# Patient Record
Sex: Male | Born: 1950 | Race: Black or African American | Hispanic: No
Health system: Southern US, Community
[De-identification: ages and names within clinical notes are randomized; demographics above are authoritative.]

---

## 2005-01-18 ENCOUNTER — Emergency Department: Payer: Self-pay | Admitting: Emergency Medicine

## 2005-02-05 ENCOUNTER — Inpatient Hospital Stay: Payer: Self-pay | Admitting: Internal Medicine

## 2005-03-19 ENCOUNTER — Emergency Department: Payer: Self-pay | Admitting: Emergency Medicine

## 2005-03-30 ENCOUNTER — Emergency Department: Payer: Self-pay | Admitting: Emergency Medicine

## 2005-03-30 ENCOUNTER — Other Ambulatory Visit: Payer: Self-pay

## 2005-06-16 ENCOUNTER — Inpatient Hospital Stay: Payer: Self-pay | Admitting: Internal Medicine

## 2005-06-16 ENCOUNTER — Other Ambulatory Visit: Payer: Self-pay

## 2005-06-17 ENCOUNTER — Other Ambulatory Visit: Payer: Self-pay

## 2005-06-18 ENCOUNTER — Other Ambulatory Visit: Payer: Self-pay

## 2005-06-28 ENCOUNTER — Other Ambulatory Visit: Payer: Self-pay

## 2005-06-28 ENCOUNTER — Emergency Department: Payer: Self-pay | Admitting: Emergency Medicine

## 2005-07-01 ENCOUNTER — Emergency Department: Payer: Self-pay | Admitting: Emergency Medicine

## 2005-07-01 ENCOUNTER — Other Ambulatory Visit: Payer: Self-pay

## 2005-09-07 ENCOUNTER — Other Ambulatory Visit: Payer: Self-pay

## 2005-09-07 ENCOUNTER — Emergency Department: Payer: Self-pay | Admitting: Emergency Medicine

## 2005-12-11 ENCOUNTER — Other Ambulatory Visit: Payer: Self-pay

## 2005-12-11 ENCOUNTER — Emergency Department: Payer: Self-pay | Admitting: Emergency Medicine

## 2005-12-13 ENCOUNTER — Other Ambulatory Visit: Payer: Self-pay

## 2005-12-13 ENCOUNTER — Emergency Department: Payer: Self-pay | Admitting: Emergency Medicine

## 2006-01-09 ENCOUNTER — Other Ambulatory Visit: Payer: Self-pay

## 2006-01-10 ENCOUNTER — Inpatient Hospital Stay: Payer: Self-pay | Admitting: Internal Medicine

## 2006-03-03 ENCOUNTER — Inpatient Hospital Stay: Payer: Self-pay | Admitting: Internal Medicine

## 2006-03-03 ENCOUNTER — Other Ambulatory Visit: Payer: Self-pay

## 2006-05-04 ENCOUNTER — Observation Stay: Payer: Self-pay | Admitting: Internal Medicine

## 2006-08-19 ENCOUNTER — Other Ambulatory Visit: Payer: Self-pay

## 2006-08-19 ENCOUNTER — Observation Stay: Payer: Self-pay | Admitting: Cardiovascular Disease

## 2006-12-01 ENCOUNTER — Inpatient Hospital Stay: Payer: Self-pay | Admitting: *Deleted

## 2006-12-01 ENCOUNTER — Other Ambulatory Visit: Payer: Self-pay

## 2006-12-19 ENCOUNTER — Other Ambulatory Visit: Payer: Self-pay

## 2006-12-19 ENCOUNTER — Inpatient Hospital Stay: Payer: Self-pay | Admitting: Internal Medicine

## 2006-12-20 ENCOUNTER — Other Ambulatory Visit: Payer: Self-pay

## 2007-01-19 ENCOUNTER — Ambulatory Visit: Payer: Self-pay | Admitting: Internal Medicine

## 2007-01-28 ENCOUNTER — Emergency Department: Payer: Self-pay | Admitting: Unknown Physician Specialty

## 2007-01-28 ENCOUNTER — Other Ambulatory Visit: Payer: Self-pay

## 2007-02-19 ENCOUNTER — Emergency Department: Payer: Self-pay | Admitting: Internal Medicine

## 2007-02-19 ENCOUNTER — Other Ambulatory Visit: Payer: Self-pay

## 2007-03-07 ENCOUNTER — Other Ambulatory Visit: Payer: Self-pay

## 2007-03-07 ENCOUNTER — Emergency Department: Payer: Self-pay | Admitting: Unknown Physician Specialty

## 2007-03-11 ENCOUNTER — Emergency Department: Payer: Self-pay | Admitting: Emergency Medicine

## 2007-04-21 ENCOUNTER — Ambulatory Visit: Payer: Self-pay | Admitting: Internal Medicine

## 2007-07-02 ENCOUNTER — Other Ambulatory Visit: Payer: Self-pay

## 2007-07-02 ENCOUNTER — Ambulatory Visit: Payer: Self-pay | Admitting: Internal Medicine

## 2007-07-02 ENCOUNTER — Emergency Department: Payer: Self-pay | Admitting: Internal Medicine

## 2007-07-20 ENCOUNTER — Other Ambulatory Visit: Payer: Self-pay

## 2007-07-20 ENCOUNTER — Ambulatory Visit: Payer: Self-pay | Admitting: Internal Medicine

## 2007-09-11 ENCOUNTER — Emergency Department: Payer: Self-pay | Admitting: Emergency Medicine

## 2007-09-11 ENCOUNTER — Other Ambulatory Visit: Payer: Self-pay

## 2007-10-21 ENCOUNTER — Emergency Department: Payer: Self-pay | Admitting: Internal Medicine

## 2007-10-21 ENCOUNTER — Other Ambulatory Visit: Payer: Self-pay

## 2007-11-22 ENCOUNTER — Other Ambulatory Visit: Payer: Self-pay

## 2007-11-22 ENCOUNTER — Inpatient Hospital Stay: Payer: Self-pay | Admitting: Internal Medicine

## 2007-12-30 ENCOUNTER — Other Ambulatory Visit: Payer: Self-pay

## 2007-12-30 ENCOUNTER — Inpatient Hospital Stay: Payer: Self-pay | Admitting: *Deleted

## 2008-01-31 ENCOUNTER — Ambulatory Visit: Payer: Self-pay | Admitting: Gastroenterology

## 2008-01-31 ENCOUNTER — Other Ambulatory Visit: Payer: Self-pay

## 2008-02-01 ENCOUNTER — Observation Stay: Payer: Self-pay | Admitting: Internal Medicine

## 2008-02-14 ENCOUNTER — Other Ambulatory Visit: Payer: Self-pay

## 2008-02-14 ENCOUNTER — Emergency Department: Payer: Self-pay | Admitting: Emergency Medicine

## 2008-03-05 ENCOUNTER — Other Ambulatory Visit: Payer: Self-pay

## 2008-03-05 ENCOUNTER — Emergency Department: Payer: Self-pay | Admitting: Emergency Medicine

## 2008-03-17 ENCOUNTER — Inpatient Hospital Stay: Payer: Self-pay | Admitting: Internal Medicine

## 2008-03-17 ENCOUNTER — Other Ambulatory Visit: Payer: Self-pay

## 2008-04-20 ENCOUNTER — Ambulatory Visit: Payer: Self-pay | Admitting: Family Medicine

## 2008-07-15 ENCOUNTER — Other Ambulatory Visit: Payer: Self-pay

## 2008-07-15 ENCOUNTER — Emergency Department: Payer: Self-pay | Admitting: Unknown Physician Specialty

## 2008-08-07 ENCOUNTER — Ambulatory Visit: Payer: Self-pay | Admitting: Internal Medicine

## 2008-11-29 ENCOUNTER — Emergency Department: Payer: Self-pay | Admitting: Emergency Medicine

## 2009-02-27 ENCOUNTER — Observation Stay: Payer: Self-pay | Admitting: Internal Medicine

## 2010-03-25 ENCOUNTER — Inpatient Hospital Stay: Payer: Self-pay | Admitting: Internal Medicine

## 2010-05-07 ENCOUNTER — Emergency Department: Payer: Self-pay | Admitting: Emergency Medicine

## 2010-09-13 ENCOUNTER — Inpatient Hospital Stay: Payer: Self-pay | Admitting: Internal Medicine

## 2010-10-29 ENCOUNTER — Inpatient Hospital Stay: Payer: Self-pay | Admitting: Internal Medicine

## 2011-01-04 ENCOUNTER — Ambulatory Visit: Payer: Self-pay | Admitting: Internal Medicine

## 2011-01-09 ENCOUNTER — Ambulatory Visit: Payer: Self-pay | Admitting: Internal Medicine

## 2011-02-18 ENCOUNTER — Emergency Department: Payer: Self-pay | Admitting: Emergency Medicine

## 2011-05-17 ENCOUNTER — Inpatient Hospital Stay: Payer: Self-pay | Admitting: Specialist

## 2012-02-08 LAB — TROPONIN I: Troponin-I: 0.05 ng/mL

## 2012-02-08 LAB — COMPREHENSIVE METABOLIC PANEL
Alkaline Phosphatase: 112 U/L (ref 50–136)
BUN: 11 mg/dL (ref 7–18)
Bilirubin,Total: 1.1 mg/dL — ABNORMAL HIGH (ref 0.2–1.0)
Calcium, Total: 9.1 mg/dL (ref 8.5–10.1)
Co2: 27 mmol/L (ref 21–32)
Creatinine: 0.83 mg/dL (ref 0.60–1.30)
EGFR (African American): >60
EGFR (Non-African Amer.): >60
Glucose: 170 mg/dL — ABNORMAL HIGH (ref 65–99)
Potassium: 3.7 mmol/L (ref 3.5–5.1)
SGOT(AST): 31 U/L (ref 15–37)
SGPT (ALT): 25 U/L
Total Protein: 7 g/dL (ref 6.4–8.2)

## 2012-02-08 LAB — CBC
HGB: 12.5 g/dL — ABNORMAL LOW (ref 13.0–18.0)
MCH: 32.2 pg (ref 26.0–34.0)
MCHC: 34.2 g/dL (ref 32.0–36.0)
MCV: 94 fL (ref 80–100)
RBC: 3.88 10*6/uL — ABNORMAL LOW (ref 4.40–5.90)
RDW: 12.7 % (ref 11.5–14.5)

## 2012-02-08 LAB — CK TOTAL AND CKMB (NOT AT ARMC): CK-MB: 1.7 ng/mL (ref 0.5–3.6)

## 2012-02-08 LAB — PROTIME-INR
INR: 1.2
Prothrombin Time: 15.2 secs — ABNORMAL HIGH (ref 11.5–14.7)

## 2012-02-08 LAB — APTT: Activated PTT: 42.8 secs — ABNORMAL HIGH (ref 23.6–35.9)

## 2012-02-09 ENCOUNTER — Observation Stay: Payer: Self-pay | Admitting: Internal Medicine

## 2012-02-09 LAB — CK TOTAL AND CKMB (NOT AT ARMC)
CK, Total: 152 U/L (ref 35–232)
CK, Total: 121 U/L (ref 35–232)
CK-MB: 1.2 ng/mL (ref 0.5–3.6)

## 2012-02-09 LAB — LIPID PANEL
HDL Cholesterol: 39 mg/dL — ABNORMAL LOW (ref 40–60)
Ldl Cholesterol, Calc: 115 mg/dL — ABNORMAL HIGH (ref 0–100)
VLDL Cholesterol, Calc: 22 mg/dL (ref 5–40)

## 2012-02-09 LAB — HEMOGLOBIN A1C: Hemoglobin A1C: 6.4 % — ABNORMAL HIGH (ref 4.2–6.3)

## 2012-02-13 ENCOUNTER — Emergency Department: Payer: Self-pay | Admitting: Emergency Medicine

## 2012-02-14 LAB — CBC
MCH: 32.3 pg (ref 26.0–34.0)
MCV: 94 fL (ref 80–100)
Platelet: 183 10*3/uL (ref 150–440)
RDW: 12.3 % (ref 11.5–14.5)
WBC: 3.7 10*3/uL — ABNORMAL LOW (ref 3.8–10.6)

## 2012-02-14 LAB — BASIC METABOLIC PANEL
Anion Gap: 14 (ref 7–16)
BUN: 17 mg/dL (ref 7–18)
Chloride: 103 mmol/L (ref 98–107)
Co2: 25 mmol/L (ref 21–32)
Creatinine: 0.9 mg/dL (ref 0.60–1.30)
EGFR (Non-African Amer.): >60
Glucose: 313 mg/dL — ABNORMAL HIGH (ref 65–99)
Osmolality: 297 (ref 275–301)
Potassium: 4.5 mmol/L (ref 3.5–5.1)
Sodium: 142 mmol/L (ref 136–145)

## 2012-02-14 LAB — TROPONIN I: Troponin-I: 0.03 ng/mL

## 2012-09-29 ENCOUNTER — Inpatient Hospital Stay: Payer: Self-pay | Admitting: Internal Medicine

## 2012-09-29 LAB — COMPREHENSIVE METABOLIC PANEL
Albumin: 3.6 g/dL (ref 3.4–5.0)
BUN: 16 mg/dL (ref 7–18)
Bilirubin,Total: 1.6 mg/dL — ABNORMAL HIGH (ref 0.2–1.0)
Chloride: 106 mmol/L (ref 98–107)
Co2: 25 mmol/L (ref 21–32)
Creatinine: 1.17 mg/dL (ref 0.60–1.30)
EGFR (African American): >60
EGFR (Non-African Amer.): >60
Osmolality: 286 (ref 275–301)
SGOT(AST): 36 U/L (ref 15–37)
SGPT (ALT): 37 U/L (ref 12–78)
Sodium: 142 mmol/L (ref 136–145)
Total Protein: 7.1 g/dL (ref 6.4–8.2)

## 2012-09-29 LAB — CBC WITH DIFFERENTIAL/PLATELET
Basophil #: 0 10*3/uL (ref 0.0–0.1)
HGB: 12.1 g/dL — ABNORMAL LOW (ref 13.0–18.0)
Lymphocyte #: 1.8 10*3/uL (ref 1.0–3.6)
MCH: 31.6 pg (ref 26.0–34.0)
MCHC: 33.8 g/dL (ref 32.0–36.0)
MCV: 93 fL (ref 80–100)
Monocyte #: 0.4 x10 3/mm (ref 0.2–1.0)
Monocyte %: 10 %
Neutrophil #: 1.5 10*3/uL (ref 1.4–6.5)
Neutrophil %: 40.1 %
Platelet: 219 10*3/uL (ref 150–440)
RBC: 3.82 10*6/uL — ABNORMAL LOW (ref 4.40–5.90)
RDW: 13.1 % (ref 11.5–14.5)
WBC: 3.9 10*3/uL (ref 3.8–10.6)

## 2012-09-29 LAB — CK TOTAL AND CKMB (NOT AT ARMC): CK-MB: 2 ng/mL (ref 0.5–3.6)

## 2012-09-29 LAB — PROTIME-INR
INR: 1.2
Prothrombin Time: 15.3 secs — ABNORMAL HIGH (ref 11.5–14.7)

## 2012-09-30 LAB — TROPONIN I
Troponin-I: 0.03 ng/mL
Troponin-I: 0.03 ng/mL

## 2012-09-30 LAB — CBC WITH DIFFERENTIAL/PLATELET
Basophil #: 0 10*3/uL (ref 0.0–0.1)
Eosinophil #: 0.1 10*3/uL (ref 0.0–0.7)
Eosinophil %: 2.3 %
HGB: 13 g/dL (ref 13.0–18.0)
Lymphocyte #: 1.4 10*3/uL (ref 1.0–3.6)
Lymphocyte %: 37 %
MCHC: 34.7 g/dL (ref 32.0–36.0)
Monocyte %: 8.7 %
Neutrophil %: 51.3 %
Platelet: 184 10*3/uL (ref 150–440)
RDW: 13.1 % (ref 11.5–14.5)
WBC: 3.9 10*3/uL (ref 3.8–10.6)

## 2012-09-30 LAB — COMPREHENSIVE METABOLIC PANEL
Albumin: 3.5 g/dL (ref 3.4–5.0)
Anion Gap: 8 (ref 7–16)
Calcium, Total: 8.5 mg/dL (ref 8.5–10.1)
Co2: 30 mmol/L (ref 21–32)
EGFR (Non-African Amer.): >60
Glucose: 138 mg/dL — ABNORMAL HIGH (ref 65–99)
Osmolality: 280 (ref 275–301)
Potassium: 3.4 mmol/L — ABNORMAL LOW (ref 3.5–5.1)
SGOT(AST): 35 U/L (ref 15–37)
SGPT (ALT): 33 U/L (ref 12–78)
Sodium: 139 mmol/L (ref 136–145)

## 2012-09-30 LAB — TSH: Thyroid Stimulating Horm: 3.18 u[IU]/mL

## 2012-09-30 LAB — LIPID PANEL
Cholesterol: 172 mg/dL (ref 0–200)
HDL Cholesterol: 37 mg/dL — ABNORMAL LOW (ref 40–60)
Ldl Cholesterol, Calc: 108 mg/dL — ABNORMAL HIGH (ref 0–100)
Triglycerides: 135 mg/dL (ref 0–200)

## 2012-09-30 LAB — PRO B NATRIURETIC PEPTIDE: B-Type Natriuretic Peptide: 1541 pg/mL — ABNORMAL HIGH (ref 0–125)

## 2012-12-09 ENCOUNTER — Ambulatory Visit: Payer: Self-pay | Admitting: Family Medicine

## 2013-01-11 ENCOUNTER — Inpatient Hospital Stay: Payer: Self-pay | Admitting: Internal Medicine

## 2013-01-11 LAB — BASIC METABOLIC PANEL
Anion Gap: 10 (ref 7–16)
BUN: 15 mg/dL (ref 7–18)
Chloride: 106 mmol/L (ref 98–107)
Co2: 24 mmol/L (ref 21–32)
Creatinine: 0.98 mg/dL (ref 0.60–1.30)
EGFR (African American): >60
EGFR (Non-African Amer.): >60
Glucose: 151 mg/dL — ABNORMAL HIGH (ref 65–99)
Osmolality: 283 (ref 275–301)
Potassium: 3.6 mmol/L (ref 3.5–5.1)

## 2013-01-11 LAB — CBC
HGB: 10.7 g/dL — ABNORMAL LOW (ref 13.0–18.0)
MCH: 30.3 pg (ref 26.0–34.0)
MCHC: 32.3 g/dL (ref 32.0–36.0)
MCV: 94 fL (ref 80–100)
Platelet: 187 10*3/uL (ref 150–440)
RDW: 13.2 % (ref 11.5–14.5)
WBC: 3.2 10*3/uL — ABNORMAL LOW (ref 3.8–10.6)

## 2013-01-11 LAB — CK TOTAL AND CKMB (NOT AT ARMC)
CK, Total: 241 U/L — ABNORMAL HIGH (ref 35–232)
CK, Total: 258 U/L — ABNORMAL HIGH (ref 35–232)
CK-MB: 1.5 ng/mL (ref 0.5–3.6)

## 2013-01-11 LAB — APTT: Activated PTT: 37.6 secs — ABNORMAL HIGH (ref 23.6–35.9)

## 2013-01-11 LAB — TROPONIN I
Troponin-I: 0.03 ng/mL
Troponin-I: 0.05 ng/mL
Troponin-I: 0.07 ng/mL — ABNORMAL HIGH

## 2013-06-06 ENCOUNTER — Ambulatory Visit: Payer: Self-pay | Admitting: Gastroenterology

## 2013-06-06 LAB — CBC WITH DIFFERENTIAL/PLATELET
Basophil #: 0 10*3/uL (ref 0.0–0.1)
Basophil %: 1.2 %
Eosinophil #: 0.1 10*3/uL (ref 0.0–0.7)
Eosinophil %: 3.1 %
HGB: 12.6 g/dL — ABNORMAL LOW (ref 13.0–18.0)
Lymphocyte %: 39.8 %
MCH: 30.6 pg (ref 26.0–34.0)
Monocyte #: 0.5 x10 3/mm (ref 0.2–1.0)
Monocyte %: 12.7 %
Neutrophil %: 43.2 %
WBC: 4.1 10*3/uL (ref 3.8–10.6)

## 2013-06-06 LAB — PROTIME-INR
INR: 1.2
Prothrombin Time: 15.1 secs — ABNORMAL HIGH (ref 11.5–14.7)

## 2013-06-28 ENCOUNTER — Emergency Department: Payer: Self-pay | Admitting: Emergency Medicine

## 2013-06-28 LAB — COMPREHENSIVE METABOLIC PANEL
Anion Gap: 6 — ABNORMAL LOW (ref 7–16)
Calcium, Total: 8.9 mg/dL (ref 8.5–10.1)
Chloride: 106 mmol/L (ref 98–107)
Co2: 28 mmol/L (ref 21–32)
Creatinine: 1.37 mg/dL — ABNORMAL HIGH (ref 0.60–1.30)
EGFR (African American): >60
EGFR (Non-African Amer.): 56 — ABNORMAL LOW
Glucose: 84 mg/dL (ref 65–99)
Osmolality: 282 (ref 275–301)
SGOT(AST): 45 U/L — ABNORMAL HIGH (ref 15–37)
SGPT (ALT): 29 U/L (ref 12–78)
Sodium: 140 mmol/L (ref 136–145)
Total Protein: 7.5 g/dL (ref 6.4–8.2)

## 2013-06-28 LAB — URINALYSIS, COMPLETE
Glucose,UR: NEGATIVE mg/dL (ref 0–75)
Hyaline Cast: 2
Leukocyte Esterase: NEGATIVE
Nitrite: NEGATIVE
Ph: 7 (ref 4.5–8.0)
Specific Gravity: 1.006 (ref 1.003–1.030)
Squamous Epithelial: NONE SEEN
WBC UR: <1 /HPF (ref 0–5)

## 2013-06-28 LAB — CBC WITH DIFFERENTIAL/PLATELET
Basophil #: 0.1 10*3/uL (ref 0.0–0.1)
Eosinophil #: 0.1 10*3/uL (ref 0.0–0.7)
Eosinophil %: 1.1 %
HCT: 36 % — ABNORMAL LOW (ref 40.0–52.0)
HGB: 12.5 g/dL — ABNORMAL LOW (ref 13.0–18.0)
Lymphocyte #: 2 10*3/uL (ref 1.0–3.6)
MCH: 31.5 pg (ref 26.0–34.0)
MCHC: 34.8 g/dL (ref 32.0–36.0)
Monocyte #: 0.5 x10 3/mm (ref 0.2–1.0)
Monocyte %: 9.8 %
Neutrophil #: 2.5 10*3/uL (ref 1.4–6.5)
Platelet: 229 10*3/uL (ref 150–440)
RBC: 3.97 10*6/uL — ABNORMAL LOW (ref 4.40–5.90)
RDW: 13.5 % (ref 11.5–14.5)

## 2013-06-28 LAB — TROPONIN I: Troponin-I: 0.04 ng/mL

## 2013-08-19 ENCOUNTER — Emergency Department: Payer: Self-pay | Admitting: Emergency Medicine

## 2013-08-29 ENCOUNTER — Emergency Department: Payer: Self-pay | Admitting: Emergency Medicine

## 2013-10-17 ENCOUNTER — Emergency Department: Payer: Self-pay | Admitting: Emergency Medicine

## 2013-10-17 LAB — CBC
MCH: 31.4 pg (ref 26.0–34.0)
MCHC: 34 g/dL (ref 32.0–36.0)
MCV: 93 fL (ref 80–100)
Platelet: 209 10*3/uL (ref 150–440)
RDW: 13.8 % (ref 11.5–14.5)

## 2013-10-17 LAB — TROPONIN I: Troponin-I: 0.05 ng/mL

## 2013-10-17 LAB — BASIC METABOLIC PANEL
Chloride: 101 mmol/L (ref 98–107)
Co2: 29 mmol/L (ref 21–32)
EGFR (Non-African Amer.): 56 — ABNORMAL LOW
Osmolality: 278 (ref 275–301)

## 2013-11-13 IMAGING — CT CT ABD-PELV W/ CM
1 of 3 series · 13 of 32 positions shown, 19 images · non-contrast
Comparison: none

REASON FOR EXAM: (1) abd paiin, vomiting today; (2) abd apin and vomiting
COMMENTS:

[Series 2: 3mm soft tissue · axial · 0.70mm/px · z∈[-323,+58]mm · 13 of 147 slices shown, 19 images]
[im 10/147  soft-tissue]
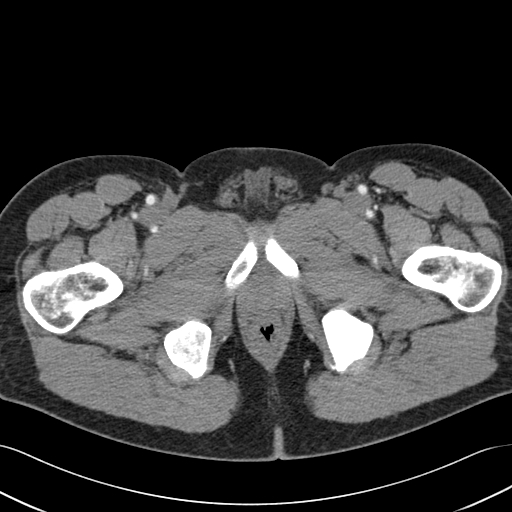
[im 10/147  bone]
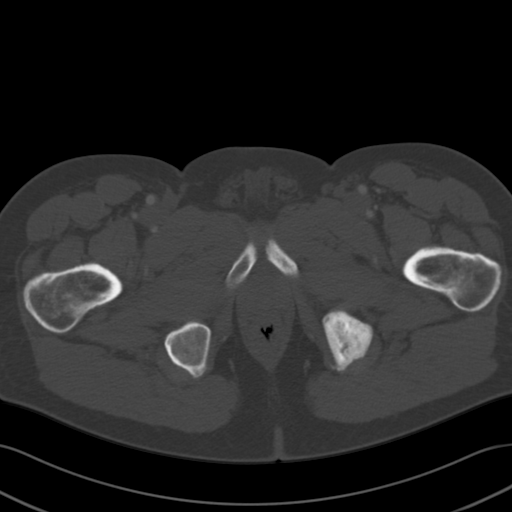
[im 20/147  soft-tissue]
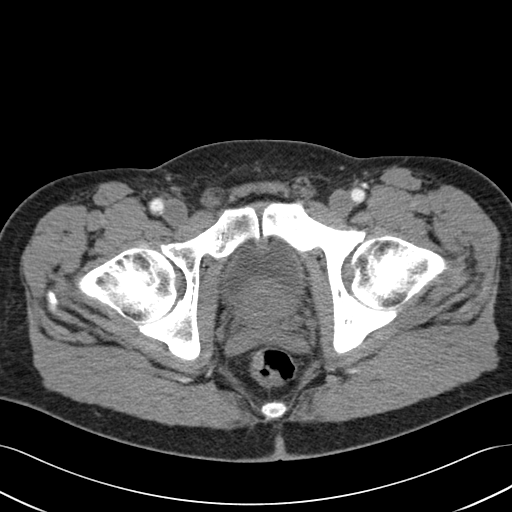
[im 30/147  soft-tissue]
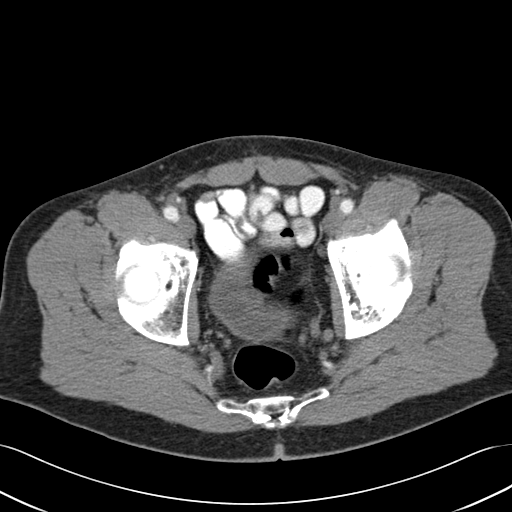
[im 39/147  soft-tissue]
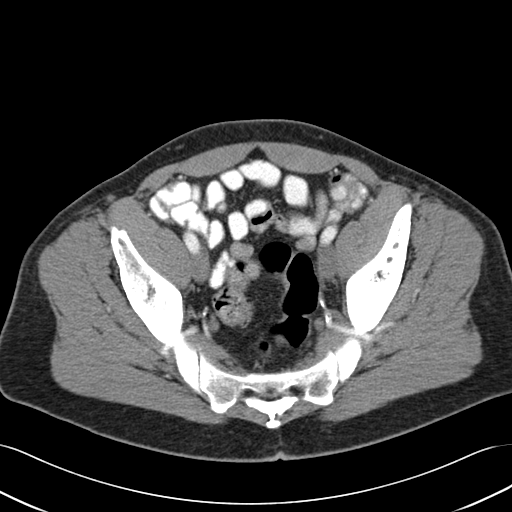
[im 49/147  soft-tissue]
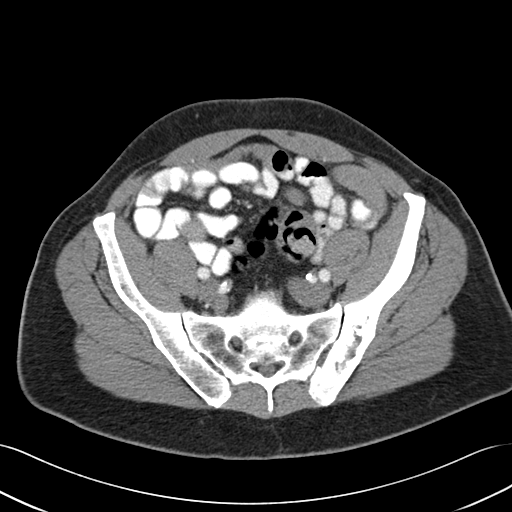
[im 59/147  soft-tissue]
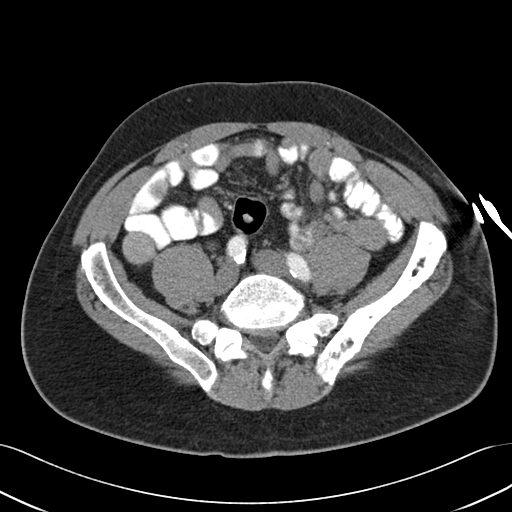
[im 78/147  soft-tissue]
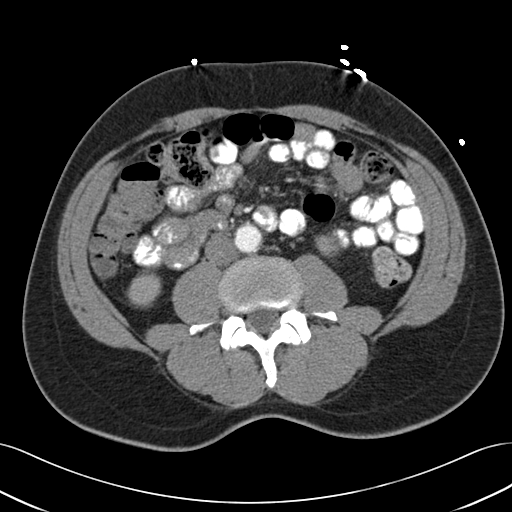
[im 88/147  soft-tissue]
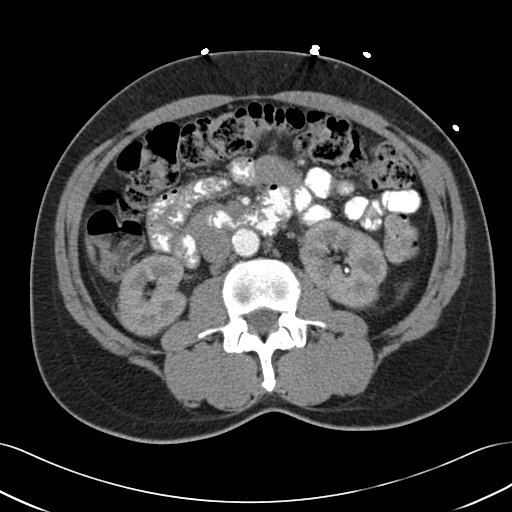
[im 98/147  soft-tissue]
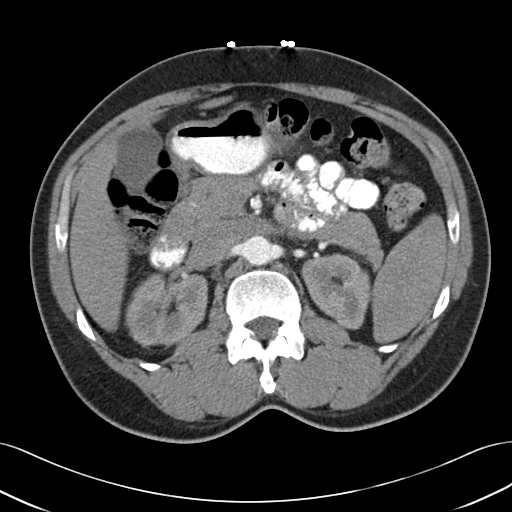
[im 98/147  bone]
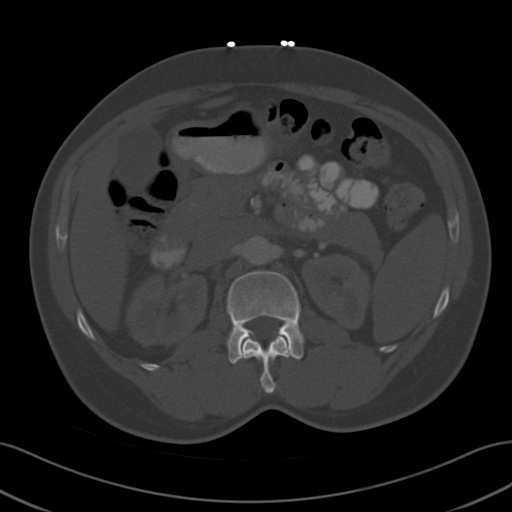
[im 108/147  soft-tissue]
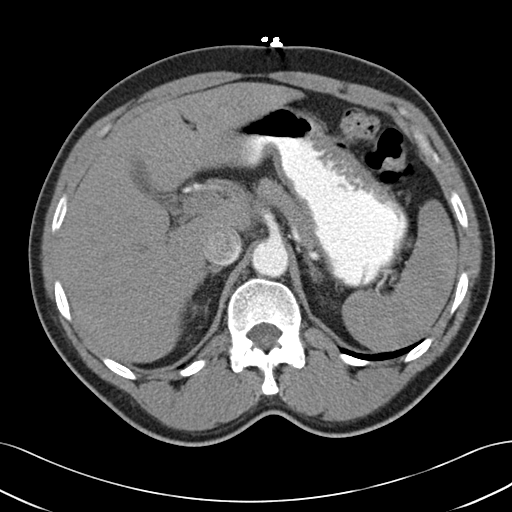
[im 108/147  lung]
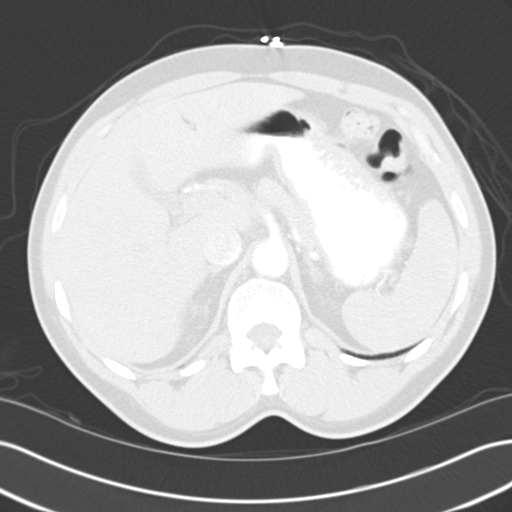
[im 117/147  soft-tissue]
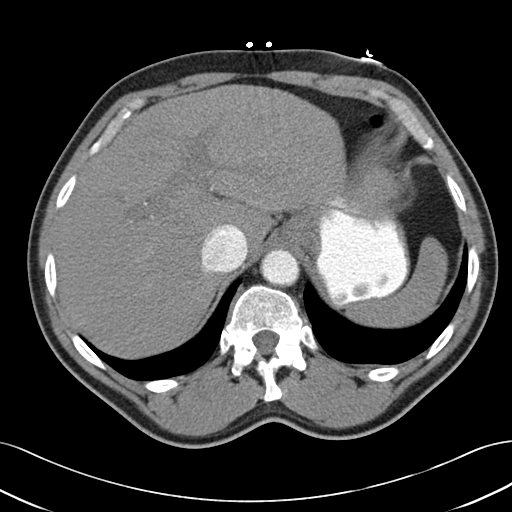
[im 117/147  lung]
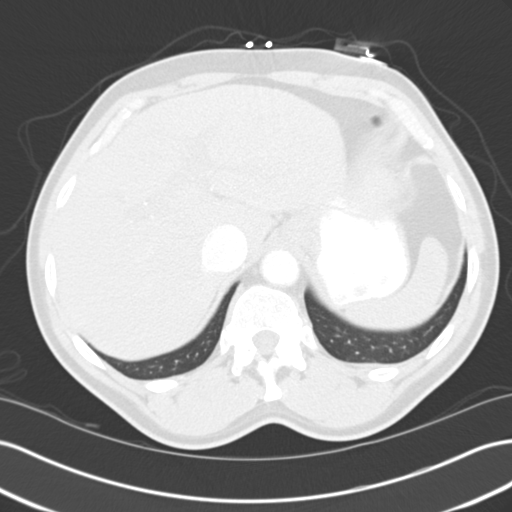
[im 127/147  soft-tissue]
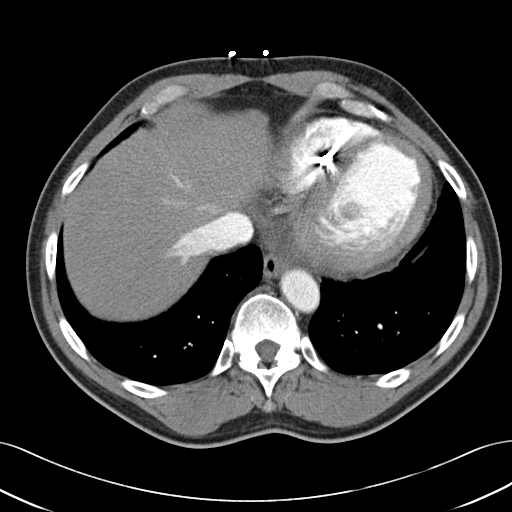
[im 127/147  lung]
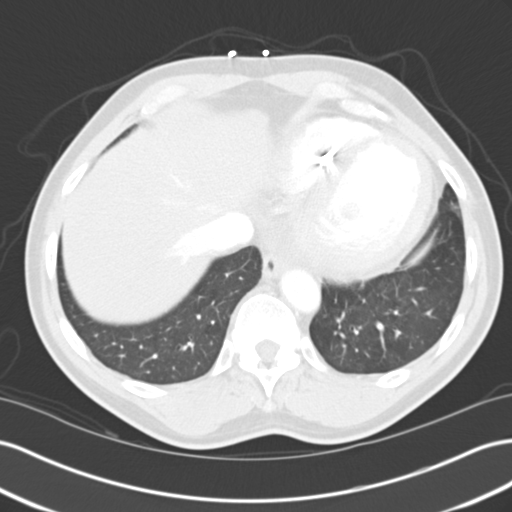
[im 137/147  soft-tissue]
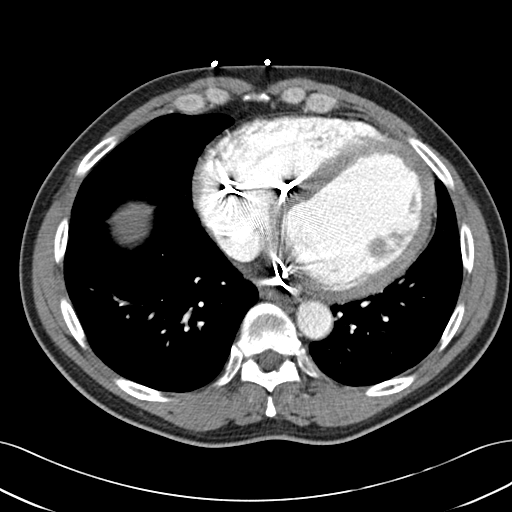
[im 137/147  lung]
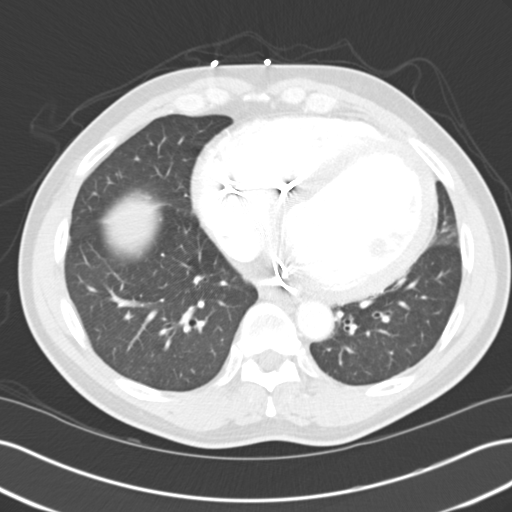

[13 of 32 positions shown; findings below may reference images not displayed]

PROCEDURE:     CT  - CT ABDOMEN / PELVIS  W  - June 28, 2013  [DATE]

RESULT:     CT of the abdomen and pelvis is performed with 100 mL of
Osovue-5MM iodinated intravenous contrast with oral contrast. Images are
reconstructed at 3 mm slice thickness in the axial plane. The patient has no
previous similar exam for comparison.

Oral contrast is present in the stomach and loops of small bowel. The
contrast has not reached the colon. The appendix is seen and appears normal
without abnormal distention or surrounding inflammation. Bilateral stones
are seen in the kidneys. Stone in the left kidney midpole region measures up
to 4.1 mm. The largest stone in the midpole of the right kidney is 2.5 mm.
Multiple smaller stones are seen in the mid to lower portions of both
kidneys without hydronephrosis or hydroureter area no definite radiopaque
gallstones are evident. There is no abnormal the bowel wall thickening or
evidence of bowel obstruction. Atherosclerotic calcification is present
within the aorta without aneurysm. No adenopathy is present. The heart is
mildly enlarged. The lung bases show minimal atelectasis in the lingula.
Delayed postcontrast images demonstrate both kidneys excrete contrast
opacified urine into nondistended proximal ureters. The liver shows no focal
mass. The spleen is unremarkable. The pancreas appears normal. The urinary
bladder contains a small amount of urine. The prostate appears to be mildly
prominent with some calcification. The bony structures appear unremarkable.
Lumbar and thoracic spine alignment appears to be normal in the areas
included.
IMPRESSION: 1. Bilateral nephrolithiasis without ureterolithiasis or hydronephrosis.
2. Normal appearing appendix.
3. No acute infectious or inflammatory process evident.
4. The lung bases are clear.
5. Cardiomegaly with atherosclerotic calcification present.

[REDACTED]

## 2013-11-13 IMAGING — CR DG CHEST 1V PORT
1 series · 1 of 1 positions shown · non-contrast
Comparison: none

REASON FOR EXAM: abd pain
COMMENTS:

[ap]
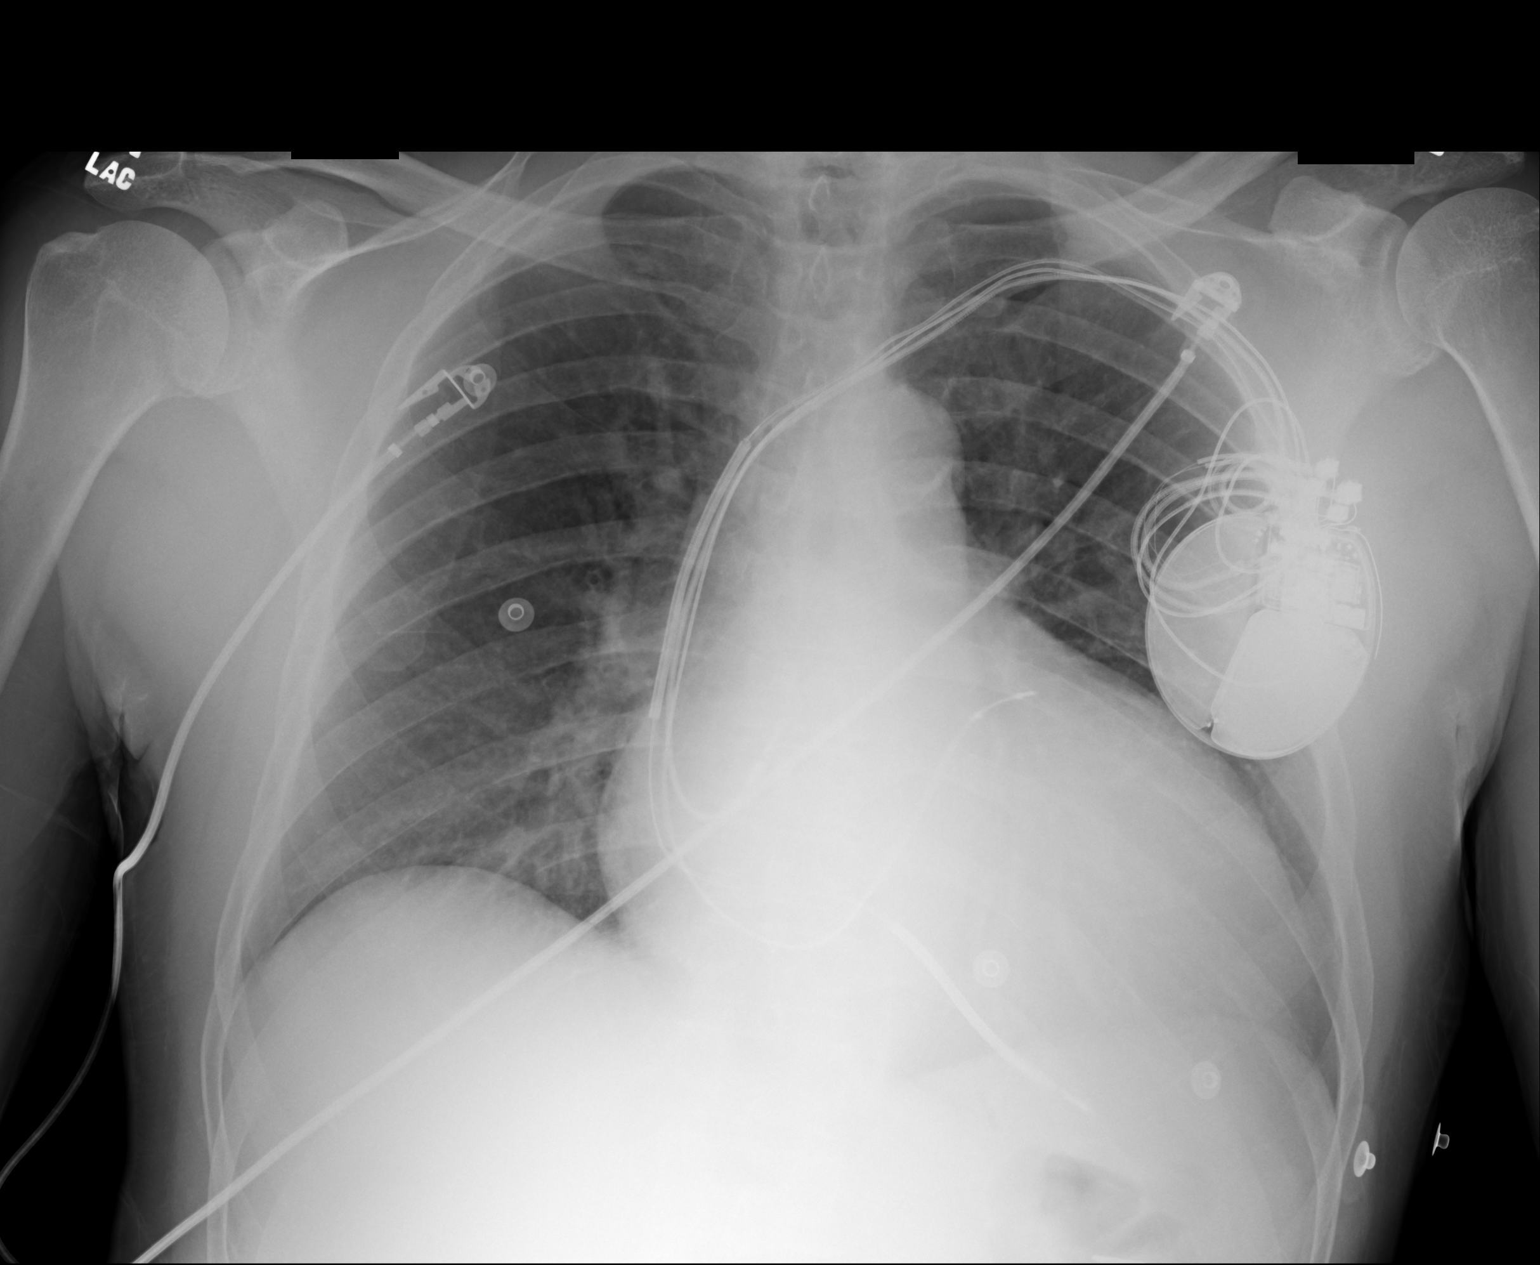

[1 of 1 positions shown; findings below may reference images not displayed]

PROCEDURE:     DXR - DXR PORTABLE CHEST SINGLE VIEW  - June 28, 2013  [DATE]

RESULT:     Comparison is made to the study January 10, 2013.

The cardiac silhouette remains enlarged. The permanent pacemaker
defibrillator is unchanged in position. There is prominence of the central
pulmonary vascularity but the congestion is less prominent today than on the
earlier study. There is no pleural effusion. The lungs exhibit no evidence
of pneumonia.
IMPRESSION: The findings suggest low-grade compensated CHF. No focal
pneumonia is demonstrated.

[REDACTED]

## 2013-12-23 ENCOUNTER — Emergency Department: Payer: Self-pay | Admitting: Emergency Medicine

## 2013-12-23 LAB — COMPREHENSIVE METABOLIC PANEL
ALT: 32 U/L (ref 12–78)
Albumin: 3.3 g/dL — ABNORMAL LOW (ref 3.4–5.0)
Alkaline Phosphatase: 115 U/L
Anion Gap: 4 — ABNORMAL LOW (ref 7–16)
BUN: 14 mg/dL (ref 7–18)
Bilirubin,Total: 1 mg/dL (ref 0.2–1.0)
Calcium, Total: 8.7 mg/dL (ref 8.5–10.1)
Chloride: 106 mmol/L (ref 98–107)
Co2: 28 mmol/L (ref 21–32)
Creatinine: 1 mg/dL (ref 0.60–1.30)
EGFR (Non-African Amer.): >60
Glucose: 101 mg/dL — ABNORMAL HIGH (ref 65–99)
OSMOLALITY: 276 (ref 275–301)
Potassium: 3.4 mmol/L — ABNORMAL LOW (ref 3.5–5.1)
SGOT(AST): 44 U/L — ABNORMAL HIGH (ref 15–37)
Sodium: 138 mmol/L (ref 136–145)
TOTAL PROTEIN: 6.9 g/dL (ref 6.4–8.2)

## 2013-12-23 LAB — CBC
HCT: 30.6 % — ABNORMAL LOW (ref 40.0–52.0)
HGB: 10.6 g/dL — AB (ref 13.0–18.0)
MCH: 31.8 pg (ref 26.0–34.0)
MCHC: 34.6 g/dL (ref 32.0–36.0)
MCV: 92 fL (ref 80–100)
Platelet: 158 10*3/uL (ref 150–440)
RBC: 3.32 10*6/uL — AB (ref 4.40–5.90)
RDW: 13.3 % (ref 11.5–14.5)
WBC: 3.9 10*3/uL (ref 3.8–10.6)

## 2013-12-23 LAB — TROPONIN I: Troponin-I: 0.1 ng/mL — ABNORMAL HIGH

## 2013-12-23 LAB — MAGNESIUM: Magnesium: 1.6 mg/dL — ABNORMAL LOW

## 2014-04-09 ENCOUNTER — Ambulatory Visit: Payer: Self-pay | Admitting: Cardiology

## 2014-04-09 LAB — CBC WITH DIFFERENTIAL/PLATELET
Basophil #: 0 10*3/uL (ref 0.0–0.1)
Basophil %: 0.4 %
Eosinophil #: 0.4 10*3/uL (ref 0.0–0.7)
Eosinophil %: 3.9 %
HCT: 32.6 % — ABNORMAL LOW (ref 40.0–52.0)
HGB: 10.8 g/dL — ABNORMAL LOW (ref 13.0–18.0)
LYMPHS PCT: 11.8 %
Lymphocyte #: 1.1 10*3/uL (ref 1.0–3.6)
MCH: 28.5 pg (ref 26.0–34.0)
MCHC: 33.2 g/dL (ref 32.0–36.0)
MCV: 86 fL (ref 80–100)
Monocyte #: 0.8 x10 3/mm (ref 0.2–1.0)
Monocyte %: 8.9 %
Neutrophil #: 7 10*3/uL — ABNORMAL HIGH (ref 1.4–6.5)
Neutrophil %: 75 %
PLATELETS: 325 10*3/uL (ref 150–440)
RBC: 3.79 10*6/uL — ABNORMAL LOW (ref 4.40–5.90)
RDW: 15.1 % — ABNORMAL HIGH (ref 11.5–14.5)
WBC: 9.3 10*3/uL (ref 3.8–10.6)

## 2014-04-09 LAB — BASIC METABOLIC PANEL
Anion Gap: 12 (ref 7–16)
BUN: 15 mg/dL (ref 7–18)
CHLORIDE: 100 mmol/L (ref 98–107)
CREATININE: 1.05 mg/dL (ref 0.60–1.30)
Calcium, Total: 8.8 mg/dL (ref 8.5–10.1)
Co2: 25 mmol/L (ref 21–32)
EGFR (African American): >60
EGFR (Non-African Amer.): >60
Glucose: 161 mg/dL — ABNORMAL HIGH (ref 65–99)
Osmolality: 278 (ref 275–301)
POTASSIUM: 4.2 mmol/L (ref 3.5–5.1)
SODIUM: 137 mmol/L (ref 136–145)

## 2014-04-09 LAB — PROTIME-INR
INR: 1.5
Prothrombin Time: 18.1 secs — ABNORMAL HIGH (ref 11.5–14.7)

## 2014-05-01 ENCOUNTER — Ambulatory Visit: Payer: Self-pay | Admitting: Cardiology

## 2014-05-01 LAB — BASIC METABOLIC PANEL
ANION GAP: 9 (ref 7–16)
BUN: 12 mg/dL (ref 7–18)
CO2: 26 mmol/L (ref 21–32)
CREATININE: 1.04 mg/dL (ref 0.60–1.30)
Calcium, Total: 8.9 mg/dL (ref 8.5–10.1)
Chloride: 99 mmol/L (ref 98–107)
EGFR (Non-African Amer.): >60
Glucose: 331 mg/dL — ABNORMAL HIGH (ref 65–99)
OSMOLALITY: 281 (ref 275–301)
Potassium: 4.3 mmol/L (ref 3.5–5.1)
SODIUM: 134 mmol/L — AB (ref 136–145)

## 2014-05-01 LAB — PROTIME-INR
INR: 2.3
Prothrombin Time: 24.8 secs — ABNORMAL HIGH (ref 11.5–14.7)

## 2014-05-19 ENCOUNTER — Ambulatory Visit: Payer: Self-pay

## 2014-05-19 LAB — URINALYSIS, COMPLETE
Bilirubin,UR: NEGATIVE
KETONE: NEGATIVE
Nitrite: NEGATIVE
PH: 6 (ref 4.5–8.0)
PROTEIN: NEGATIVE
SQUAMOUS EPITHELIAL: NONE SEEN
Specific Gravity: 1.015 (ref 1.003–1.030)

## 2014-05-21 LAB — URINE CULTURE

## 2014-05-22 ENCOUNTER — Ambulatory Visit: Payer: Self-pay | Admitting: Cardiology

## 2014-05-22 LAB — PROTIME-INR
INR: 1.8
Prothrombin Time: 20.7 secs — ABNORMAL HIGH (ref 11.5–14.7)

## 2014-05-22 LAB — BASIC METABOLIC PANEL
ANION GAP: 8 (ref 7–16)
BUN: 11 mg/dL (ref 7–18)
CO2: 27 mmol/L (ref 21–32)
Calcium, Total: 8.8 mg/dL (ref 8.5–10.1)
Chloride: 104 mmol/L (ref 98–107)
Creatinine: 0.97 mg/dL (ref 0.60–1.30)
EGFR (Non-African Amer.): >60
GLUCOSE: 162 mg/dL — AB (ref 65–99)
OSMOLALITY: 280 (ref 275–301)
Potassium: 4 mmol/L (ref 3.5–5.1)
SODIUM: 139 mmol/L (ref 136–145)

## 2014-05-29 ENCOUNTER — Ambulatory Visit: Payer: Self-pay | Admitting: Cardiology

## 2014-05-29 LAB — CBC WITH DIFFERENTIAL/PLATELET
Basophil #: 0 10*3/uL (ref 0.0–0.1)
Basophil %: 0.7 %
EOS ABS: 0.4 10*3/uL (ref 0.0–0.7)
Eosinophil %: 10.9 %
HCT: 35.2 % — ABNORMAL LOW (ref 40.0–52.0)
HGB: 11.6 g/dL — ABNORMAL LOW (ref 13.0–18.0)
Lymphocyte #: 1.3 10*3/uL (ref 1.0–3.6)
Lymphocyte %: 32.7 %
MCH: 29 pg (ref 26.0–34.0)
MCHC: 33.1 g/dL (ref 32.0–36.0)
MCV: 88 fL (ref 80–100)
Monocyte #: 0.3 x10 3/mm (ref 0.2–1.0)
Monocyte %: 8.1 %
NEUTROS PCT: 47.6 %
Neutrophil #: 1.9 10*3/uL (ref 1.4–6.5)
PLATELETS: 205 10*3/uL (ref 150–440)
RBC: 4.01 10*6/uL — ABNORMAL LOW (ref 4.40–5.90)
RDW: 16.4 % — AB (ref 11.5–14.5)
WBC: 3.9 10*3/uL (ref 3.8–10.6)

## 2014-05-29 LAB — BASIC METABOLIC PANEL
ANION GAP: 9 (ref 7–16)
BUN: 12 mg/dL (ref 7–18)
CALCIUM: 9 mg/dL (ref 8.5–10.1)
CO2: 27 mmol/L (ref 21–32)
Chloride: 104 mmol/L (ref 98–107)
Creatinine: 0.8 mg/dL (ref 0.60–1.30)
EGFR (African American): >60
EGFR (Non-African Amer.): >60
GLUCOSE: 148 mg/dL — AB (ref 65–99)
OSMOLALITY: 282 (ref 275–301)
Potassium: 4.2 mmol/L (ref 3.5–5.1)
SODIUM: 140 mmol/L (ref 136–145)

## 2014-05-29 LAB — PROTIME-INR
INR: 2.2
Prothrombin Time: 24.2 secs — ABNORMAL HIGH (ref 11.5–14.7)

## 2014-06-05 ENCOUNTER — Ambulatory Visit: Payer: Self-pay | Admitting: Cardiology

## 2014-06-05 LAB — PROTIME-INR
INR: 2.2
Prothrombin Time: 24 secs — ABNORMAL HIGH (ref 11.5–14.7)

## 2014-06-08 ENCOUNTER — Ambulatory Visit: Payer: Self-pay | Admitting: Cardiology

## 2014-06-08 LAB — PROTIME-INR
INR: 2.8
Prothrombin Time: 29 secs — ABNORMAL HIGH (ref 11.5–14.7)

## 2014-06-17 ENCOUNTER — Ambulatory Visit: Payer: Self-pay | Admitting: Internal Medicine

## 2014-06-18 ENCOUNTER — Ambulatory Visit: Payer: Self-pay | Admitting: Internal Medicine

## 2014-06-18 LAB — WBCS, STOOL

## 2014-06-19 ENCOUNTER — Ambulatory Visit: Payer: Self-pay | Admitting: Internal Medicine

## 2014-06-19 ENCOUNTER — Ambulatory Visit: Payer: Self-pay | Admitting: Cardiology

## 2014-06-19 LAB — BASIC METABOLIC PANEL
Anion Gap: 10 (ref 7–16)
BUN: 18 mg/dL (ref 7–18)
CO2: 25 mmol/L (ref 21–32)
CREATININE: 0.85 mg/dL (ref 0.60–1.30)
Calcium, Total: 8.7 mg/dL (ref 8.5–10.1)
Chloride: 104 mmol/L (ref 98–107)
EGFR (African American): >60
EGFR (Non-African Amer.): >60
Glucose: 125 mg/dL — ABNORMAL HIGH (ref 65–99)
OSMOLALITY: 281 (ref 275–301)
Potassium: 4.2 mmol/L (ref 3.5–5.1)
Sodium: 139 mmol/L (ref 136–145)

## 2014-06-19 LAB — MAGNESIUM: MAGNESIUM: 1.6 mg/dL — AB

## 2014-06-19 LAB — PROTIME-INR
INR: 2.8
Prothrombin Time: 28.7 secs — ABNORMAL HIGH (ref 11.5–14.7)

## 2014-06-20 LAB — STOOL CULTURE

## 2014-06-22 ENCOUNTER — Ambulatory Visit: Payer: Self-pay | Admitting: Cardiology

## 2014-06-22 LAB — PROTIME-INR
INR: 2.9
Prothrombin Time: 29.4 secs — ABNORMAL HIGH (ref 11.5–14.7)

## 2014-07-08 ENCOUNTER — Ambulatory Visit: Payer: Self-pay

## 2014-07-08 LAB — CBC WITH DIFFERENTIAL/PLATELET
BASOS PCT: 0.7 %
Basophil #: 0 10*3/uL (ref 0.0–0.1)
EOS ABS: 0.1 10*3/uL (ref 0.0–0.7)
EOS PCT: 1.2 %
HCT: 31.9 % — AB (ref 40.0–52.0)
HGB: 10.5 g/dL — AB (ref 13.0–18.0)
LYMPHS ABS: 1.2 10*3/uL (ref 1.0–3.6)
Lymphocyte %: 22.1 %
MCH: 30 pg (ref 26.0–34.0)
MCHC: 33.2 g/dL (ref 32.0–36.0)
MCV: 90 fL (ref 80–100)
MONO ABS: 0.9 x10 3/mm (ref 0.2–1.0)
Monocyte %: 16.4 %
NEUTROS PCT: 59.6 %
Neutrophil #: 3.1 10*3/uL (ref 1.4–6.5)
Platelet: 214 10*3/uL (ref 150–440)
RBC: 3.49 10*6/uL — AB (ref 4.40–5.90)
RDW: 15.6 % — ABNORMAL HIGH (ref 11.5–14.5)
WBC: 5.2 10*3/uL (ref 3.8–10.6)

## 2014-07-08 LAB — COMPREHENSIVE METABOLIC PANEL
ALK PHOS: 122 U/L — AB
ALT: 37 U/L
Albumin: 3.3 g/dL — ABNORMAL LOW (ref 3.4–5.0)
Anion Gap: 10 (ref 7–16)
BUN: 15 mg/dL (ref 7–18)
Bilirubin,Total: 1.4 mg/dL — ABNORMAL HIGH (ref 0.2–1.0)
CALCIUM: 8.8 mg/dL (ref 8.5–10.1)
CO2: 26 mmol/L (ref 21–32)
Chloride: 101 mmol/L (ref 98–107)
Creatinine: 0.99 mg/dL (ref 0.60–1.30)
EGFR (African American): >60
Glucose: 139 mg/dL — ABNORMAL HIGH (ref 65–99)
Osmolality: 277 (ref 275–301)
Potassium: 4.4 mmol/L (ref 3.5–5.1)
SGOT(AST): 48 U/L — ABNORMAL HIGH (ref 15–37)
Sodium: 137 mmol/L (ref 136–145)
TOTAL PROTEIN: 7.2 g/dL (ref 6.4–8.2)

## 2014-07-08 LAB — PROTIME-INR
INR: 1.1
PROTHROMBIN TIME: 14 s (ref 11.5–14.7)

## 2014-07-26 ENCOUNTER — Ambulatory Visit: Payer: Self-pay | Admitting: Cardiology

## 2014-07-26 LAB — PROTIME-INR
INR: 1.7
Prothrombin Time: 19.8 secs — ABNORMAL HIGH (ref 11.5–14.7)

## 2014-07-31 LAB — CLOSTRIDIUM DIFFICILE(ARMC)

## 2014-08-07 ENCOUNTER — Ambulatory Visit: Payer: Self-pay | Admitting: Cardiology

## 2014-08-07 LAB — PROTIME-INR
INR: 2.7
PROTHROMBIN TIME: 28.1 s — AB (ref 11.5–14.7)

## 2014-08-28 ENCOUNTER — Ambulatory Visit: Payer: Self-pay | Admitting: Cardiology

## 2014-08-28 LAB — BASIC METABOLIC PANEL
Anion Gap: 11 (ref 7–16)
BUN: 19 mg/dL — AB (ref 7–18)
CHLORIDE: 104 mmol/L (ref 98–107)
CO2: 25 mmol/L (ref 21–32)
Calcium, Total: 9 mg/dL (ref 8.5–10.1)
Creatinine: 0.99 mg/dL (ref 0.60–1.30)
EGFR (African American): >60
EGFR (Non-African Amer.): >60
Glucose: 173 mg/dL — ABNORMAL HIGH (ref 65–99)
OSMOLALITY: 286 (ref 275–301)
POTASSIUM: 4.1 mmol/L (ref 3.5–5.1)
SODIUM: 140 mmol/L (ref 136–145)

## 2014-08-28 LAB — PROTIME-INR
INR: 2.6
Prothrombin Time: 27.5 secs — ABNORMAL HIGH (ref 11.5–14.7)

## 2014-09-11 ENCOUNTER — Ambulatory Visit: Payer: Self-pay | Admitting: Cardiology

## 2014-09-11 LAB — PROTIME-INR
INR: 3.2
PROTHROMBIN TIME: 31.5 s — AB (ref 11.5–14.7)
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
## 2015-03-12 NOTE — Discharge Summary (Signed)
PATIENT NAME:  Jeffery [name], Jeffery [name] MR#:  [id number] DATE OF BIRTH:  23-Mar-1951  DATE OF ADMISSION:  09/29/2012 DATE OF DISCHARGE:  09/30/2012  PRIMARY CARE PHYSICIAN[facility]    FINAL DIAGNOSES:  1. Chest pain.  2. Acute systolic congestive heart failure.  3. Hypertension.  4. Hyperlipidemia.  5. History of CVA.  6. Diabetes.   MEDICATIONS ON DISCHARGE:  1. Amiodarone 200 mg 1 tablet daily.  2. Aspirin 81 mg p.o. daily.  3. Combivent 2 puffs twice a day. 4. Gabapentin 100 mg 3 to 4 tablets day. 5. Lisinopril 30 mg daily.  6. Metoprolol 100 mg daily.  7. Nexium 40 mg daily.  8. Nitroglycerin 0.4 mg sublingual every five minutes as needed for chest pain.  9. Percocet 5/325 1 to 2 tablets every four hours as needed for pain.  10. Spironolactone 25 mg daily.  11. Zantac 150 mg daily.  12. Atorvastatin 40 mg daily.  13. Lasix 80 mg 1 to 2 times a day. 14. Albuterol nebulizer 3 mL every six hours as needed for shortness of breath.   FOLLOW-UP: Follow-up in one week with [facility]   ACTIVITY: Activity as tolerated.   DIET: Low sodium, regular consistency, 1800 ADA controlled diet.   REASON FOR ADMISSION: The patient was admitted on 09/29/2012 and discharged 09/30/2012. The patient came in with chest pain and shortness of breath.   HISTORY OF PRESENT ILLNESS: The patient is a 64 year old man with coronary artery disease, MI, CVA, cardiomyopathy, defibrillator, Hepatitis C, COPD, and hypertension who came in with 10 out of 10 chest pain. The patient was admitted with chest pain and congestive heart failure, started on IV Lasix.   LABORATORY AND RADIOLOGICAL DATA DURING THE HOSPITAL COURSE: EKG showed paced.   Cardiac enzymes were negative. INR 1.2. BNP 3401. Glucose 136, BUN 16, creatinine 1.17, sodium 142, potassium 3.8, chloride 106, CO2 25, calcium 8.5, total bilirubin 1.6, alkaline phosphatase 156, ALT 37, AST 36, white blood cell count 3.9,  hemoglobin and hematocrit 12.0 and 35.6, platelet count 219.   Chest x-ray showed findings consistent with low-grade congestive heart failure.   Next two sets of cardiac enzymes were negative. LDL 108, HDL 37, triglycerides 135. Repeat BNP 1541. TSH 3.18.   Repeat chest x-ray showed findings consistent with low-grade CHF.   Creatinine upon discharge 1.13.  HOSPITAL COURSE PER PROBLEM LIST:  1. For the patient's chest pain, cardiac enzymes were negative. This is not cardiac. The patient will continue metoprolol and aspirin.  2. Acute systolic congestive heart failure. Current echocardiogram pending. The patient is on metoprolol, lisinopril, aspirin, Lasix, Aldactone. The patient was initially started on IV Lasix and lungs are clear upon discharge. The patient will go back to oral Lasix as he is taking at home.  3. Hypertension. Blood pressure currently stable, 121/83 upon discharge.  4. Hyperlipidemia. The patient is on atorvastatin.  5. History of CVA. On aspirin.  6. Diabetes, diet controlled.   TIME SPENT ON DISCHARGE: 35 minutes.   DISPOSITION: The patient was discharged home in stable condition.   ____________________________ Herschell Dimesichard J. Renae GlossWieting, MD rjw:drc D: 09/30/2012 16:18:51 ET T: 10/01/2012 16:27:23 ET JOB#: [id number]  cc: Herschell Dimesichard J. Renae GlossWieting, MD, <Dictator> Dr[facility] Richie Vadala J Deon Ivey MD ELECTRONICALLY SIGNED 10/01/2012 20:24
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
## 2015-03-17 NOTE — Discharge Summary (Signed)
PATIENT NAME:  Jeffery [name], Jeffery [name] MR#:  [id number] DATE OF BIRTH:  07/18/1951  DATE OF ADMISSION:  02/09/2012 DATE OF DISCHARGE:  02/09/2012  ADMITTING PHYSICIAN: Larena GlassmanAmir Firozvi, M.D.  DISCHARGING PHYSICIAN: Enid Baasadhika Ithiel Liebler, M.D. PRIMARY CARE PHYSICIAN: [facility].  CONSULTATIONS IN THE HOSPITAL: Cardiology consultation by Dr. Juliann Paresallwood.   DISCHARGE DIAGNOSES:  1. Chronic obstructive pulmonary disease exacerbation with viral bronchitis.  2. Cardiomyopathy.  3. Chronic congestive heart failure with systolic dysfunction, well compensated, now ejection fraction of 25%.  4. Hypertension.  5. Diet-controlled diabetes mellitus, type II.  6. Hyperlipidemia.  7. Coronary artery disease, status post stent.  8. Hepatitis C.  9. Cardiac arrhythmia status post pacemaker.   DISCHARGE HOME MEDICATIONS:  1. Lasix 80 mg p.o. daily.  2. Percocet home dosage, 1 tablet every six hours p.r.n.  3. Gabapentin 100 mg p.o. t.i.d.  4. Spironolactone 25 milligrams 1 tablet p.o. daily.  5. Zantac 150 mg p.o. daily.  6. Sublingual nitroglycerin 0.4 mg p.r.n. for chest pain.  7. Nexium 40 mg p.o. daily.  8. Lisinopril 30 mg p.o. daily.  9. Magnesium oxide 400 mg p.o. daily.  10. Amiodarone 200 mg p.o. daily.  11. Aspirin 81 mg p.o. daily.  12. Combivent inhaler 1 to 2 puffs every 6 hours p.r.n.  13. Metoprolol 50 mg p.o. b.i.d.  14. Advair 250/50, one puff b.i.d.  15. Prednisone taper.   DISCHARGE HOME OXYGEN: None.   DISCHARGE DIET: Low sodium, ADA diet.   DISCHARGE ACTIVITY: As tolerated.   FOLLOWUP INSTRUCTIONS:  1. Primary care physician follow-up in 1 to 2 weeks.  2. Cardiology followup with Dr. Juliann Paresallwood in two weeks.   LABORATORY, DIAGNOSTIC AND RADIOLOGICAL DATA: WBC 2.8, hemoglobin 12.5, hematocrit 36.6, platelet count 174. Sodium 141, potassium 3.7, chloride 106, bicarbonate 27, BUN 11, creatinine 0.83, glucose 170, calcium 9.1. AST 25, AST 31, alkaline phosphatase 112, total bili 1.1, albumin 3.7.  BNP is elevated at 1,140 on admission. INR 1.2. Cardiac enzymes remained negative. LDL total cholesterol 115, total cholesterol 176, triglycerides 111, HDL of 39. Chest x-ray showing left-sided pacemaker, normal cardiac silhouette. Clear lung fields. No acute changes. Hemoglobin A1c of 6.4.   BRIEF HOSPITAL COURSE: Jeffery [name] is a 64 year old African American male with past medical history of coronary artery disease, ischemic cardiomyopathy, congestive heart failure, ejection fraction of 25%, chronic obstructive pulmonary disease, and diabetes who presented to the hospital complaining of dyspnea on exertion and also chest pain.  1. Chest pain and dyspnea. Initially admitted for possible unstable angina and under observation to telemetry. But on further questioning, the patient's symptoms were more related to chronic obstructive pulmonary disease exacerbation and was started on prednisone taper. No evidence of any pneumonia or bacterial infection so he is not being given any antibiotics. He was seen by Dr. Juliann Paresallwood who felt the same and was discharged home in stable condition.  2. Ischemic cardiomyopathy, congestive heart failure with ejection fraction of 25%. He is well compensated at this time. He is already on home medications, Lasix, spironolactone, metoprolol and lisinopril which he will continue to take. He is being compliant with his medications lately and [facility]  3. Diet-controlled diabetes mellitus. Hemoglobin A1c is less than 7. Continue current management. 4. Cardiac arrhythmia, atrial fibrillation, V. tach status post pacemaker placement, on amiodarone. Continue that.   He was otherwise stable and his stay was uneventful in the hospital.   DISCHARGE CONDITION: Stable.   DISCHARGE DISPOSITION: Home.   TIME SPENT ON  DISCHARGE: 40 minutes.    ____________________________ Enid Baas, MD rk:ap D: 02/10/2012 16:09:16 ET T: 02/11/2012 10:12:19  ET JOB#: [id number]  cc: Enid Baas, MD, <Dictator> [facility] Enid Baas MD ELECTRONICALLY SIGNED 02/14/2012 0:06

## 2015-03-17 NOTE — Consult Note (Signed)
PATIENT NAME:  Jeffery [name], Jeffery [name] DATE OF BIRTH:  25-Jun-1951  DATE OF CONSULTATION:  02/09/2012  REFERRING PHYSICIAN:  Karolee OhsAmir A. [place] DragonFirozvi, MD. [facility]  CONSULTING PHYSICIAN:  Nikayla Madaris D. Olegario Emberson, MD  INDICATION FOR ADMISSION/CONSULT: Chest pain, shortness of breath, known cardiomyopathy and coronary artery disease.   HISTORY OF PRESENT ILLNESS: Jeffery [name] is a 64 year old African American male with history of coronary artery disease, status post cardiac catheterization, cardiomyopathy, and implantable defibrillator for congestive cardiomyopathy, atrial fibrillation and ventricular tachycardia. He is on amiodarone. He presented with worsening shortness of breath and chest pain off and on over the last two days. Chest pain has been gradual and now worsened to 10 out of 10, midsternal with no radiation. It was relieved with morphine. He has had increasing shortness of breath, was treated with hydralazine and Lasix, morphine and Zofran. [facility] He quit smoking, no alcohol or illicit drug use. He is admitted for further evaluation and care.  REVIEW OF SYSTEMS: No blackout spells or syncope. No nausea or vomiting. No fever, no chills, no sweats. No weight loss. No weight gain. No hemoptysis or hematemesis. No bright red blood per rectum. No vision change or hearing change. He denies sputum production or cough.   PAST MEDICAL HISTORY:  1. Cardiomyopathy.  2. Cerebrovascular accident.  3. Congestive heart failure, systolic dysfunction. 4. Diabetes.  5. Chronic obstructive pulmonary disease. 6. Hepatitis C. 7. Leukopenia. 8. Sickle cell trait.  9. Hypertension. 10. Automatic implantable cardiac defibrillator.   PAST SURGICAL HISTORY:  1. Automatic implantable cardiac defibrillator placement.  2. Cardiac catheterization.  FAMILY HISTORY: Coronary artery disease, diabetes, hypertension.   SOCIAL HISTORY: He quit smoking. He used to use  alcohol and cocaine but quit.  He has two children.  PHYSICAL EXAMINATION:  VITAL SIGNS: Blood pressure 140/80, pulse 72, respiratory rate 16, afebrile.   HEENT: Normocephalic, atraumatic. Pupils are reactive to light.   NECK: Supple. No significant jugular venous distention, bruits or adenopathy.   LUNGS: Clear to auscultation and percussion. Rales in the bases, rhonchi.   CARDIAC: S3, soft S4. Systolic ejection murmur at left sternal border.   ABDOMEN: Exam is benign.   EXTREMITIES: Exam within normal limits.   NEUROLOGIC: Exam is intact.   SKIN: Exam is within normal limits.   LABORATORY, DIAGNOSTIC AND RADIOLOGICAL DATA:  Chest x-ray: Cardiomegaly, congestive heart failure.  Glucose 170, BNP 1140, BUN 11, creatinine 0.83, sodium 141, potassium 3.7, chloride 106, bicarbonate 27.  Liver function tests were negative.  Troponin 0.05.  White count of 2.8, hemoglobin of 12.5, hematocrit 36.6, platelet count of 174.  PT-INR normal.  EKG: Normal sinus rhythm, paced beats, nonspecific findings.   ASSESSMENT:  1. Chest pain.  2. Cardiomyopathy.  3. Chronic obstructive pulmonary disease.  4. Diabetes.  5. Coronary artery disease. 6. Leukopenia.  7. Deep vein thrombosis.  8. Congestive heart failure.   PLAN: I agree with admit. Continue current medical therapy, treat the patient medically for now. I do not recommend invasive evaluation. I do not recommend cardiac catheterization. I would continue current therapy with Lasix, oxygen therapy. Follow-up cardiac enzymes and EKG.  I would treat the patient medically as long he continues to improve symptomatically.   ____________________________ Bobbie Stackwayne D. Juliann Paresallwood, MD ddc:cbb D: 02/10/2012 13:54:23 ET T: 02/10/2012 15:33:47 ET JOB#: [id number]  cc: Albana Saperstein D. Juliann Paresallwood, MD, <Dictator> Alwyn PeaWAYNE D Stanislaus Kaltenbach MD ELECTRONICALLY SIGNED 03/02/2012 11:40

## 2015-03-17 NOTE — H&P (Signed)
PATIENT NAME:  Jeffery [name], Jeffery [name] MR#:  [id number] DATE OF BIRTH:  [birth year]  DATE OF ADMISSION:  02/09/2012  REFERRING PHYSICIAN: Dr. Suella Broad PRIMARY CARE PHYSICIAN: [facility]  PRIMARY CARDIOLOGIST: [facility]  REASON FOR ADMISSION: Chest pain, shortness of breath.   HISTORY OF PRESENT ILLNESS: This is a 64 year old African American male with history of coronary artery disease status post cardiac catheterization, cardiomyopathy with defibrillator on amiodarone who presents with shortness of breath, chest pain which has been continuously going on for last two days. Said the chest pain was gradual and now has worsened. It was 10/10, midsternal. No radiation. Relieved by morphine. Has had increasing shortness of breath. Was given hydralazine, Lasix, morphine, Zofran. He says he used to see Dr. Juliann Pares but now has been seeing [facility]. He states he quit smoking. Does not actively use alcohol or illicit drugs or smoke. We are asked to admit the patient for chest pain.    PAST MEDICAL/SURGICAL HISTORY:  1. Cardiomyopathy, ejection fraction 25%. 2. Cerebrovascular accident. 3. History of congestive heart failure, systolic dysfunction. 4. Type 2 diabetes. 5. Chronic obstructive pulmonary disease.  6. Hepatitis C. 7. Leukopenia. 8. Sickle cell trait. 9. History of hypertension.  10. Automatic implantable cardiac defibrillator.  11. Cardiac catheterization with stent placed in 2010.   SOCIAL HISTORY: He lives at home with the sister. No alcohol or tobacco, recreational drug use. He quit smoking about eight years ago. Quit alcohol and cocaine use about seven years ago. He has two children.   FAMILY HISTORY: Positive for coronary artery disease, hypertension, diabetes. Mother died of heart disease. Father unknown.   REVIEW OF SYSTEMS: CONSTITUTIONAL: No fever but he has fatigue, weakness. EYES: No blurred vision, double vision, pain, redness, inflammation, glaucoma. ENT: No  tinnitus, ear pain, hearing loss, seasonal allergies, epistaxis, discharge. RESPIRATORY: No cough, wheezing, hemoptysis. He has dyspnea. CARDIOVASCULAR: He has chest pain but no orthopnea, edema, arrhythmias. He has dyspnea on exertion. GASTROINTESTINAL: He had nausea but no vomiting, diarrhea, abdominal pain, hematemesis, melena, gastroesophageal reflux disease. GENITOURINARY: No dysuria, hematuria, renal calculi, increased frequency, incontinence. GU/MALE: No sores, discharge, prostatitis. ENDOCRINE: No polyuria, nocturia, thyroid problems, increased sweating, heat or cold intolerance. HEME/LYMPH: No anemia, easy bruising, swollen glands. INTEGUMENT: No acne, rash, change in mole, hair, or skin. MUSCULOSKELETAL: No pain in back, shoulder, knee, hip, arthritis, swelling, gout. NEUROLOGIC: No numbness, weakness, dysarthria, epilepsy, tremor, vertigo, ataxia. He does have some dizziness. PSYCH: No anxiety, insomnia, ADD, bipolar, depression.   PHYSICAL EXAMINATION:  VITAL SIGNS: Temperature 97.7, heart rate 72, respirations 17, blood pressure 139/84, sating 96% on room air.   GENERAL: Patient is well developed, well nourished, no apparent distress, alert and oriented x3.   HEENT: Pupils equal, reactive to light and accommodation. Extraocular movements are intact. Anicteric sclerae. No difficulty hearing. Oropharynx clear.   NECK: No JVD. No thyromegaly. No lymphadenopathy. No carotid bruits.   LUNGS: Clear to auscultation. No adventitious breath sounds. He had scant wheezing. No use of accessory muscles or increased work of breathing or increased effort.   CARDIOVASCULAR: Regular rate and rhythm. Normal S1, S2. No murmurs, gallops, rubs appreciated. No lower extremity edema. 2+ dorsalis pedis pulses. PMI not lateralized. He has surgical scar.    BREASTS: No obvious mass.  ABDOMEN: Soft, nontender, nondistended. Positive bowel sounds with surgical scar.   GENITOURINARY: Deferred.    MUSCULOSKELETAL: Strength 5/5. No clubbing, cyanosis, degenerative joint disease.   SKIN: No rashes, lesions, induration. Warm to  touch.   LYMPH: No lymphadenopathy in cervical or supraclavicular area.   NEUROLOGICAL: Cranial nerves II through XII are intact. Strength 5/5. No aphasia, dysarthria, contractures. Follows commands.   PSYCH: Alert and oriented x3.   LABORATORY, DIAGNOSTIC, AND RADIOLOGICAL DATA: Chest x-ray shows no acute cardiopulmonary process. Glucose 170, BNP 1140, BUN 11, creatinine 0.83, sodium 141, potassium 3.7, chloride 106, bicarbonate 27, anion gap 8, total protein 7.0, albumin 2.7, total bilirubin 1.1, alkaline phosphatase 112, AST 31, ALT 25, CK 185, MB 1.7, troponin 0.05, white count 2.8, hemoglobin 12.5, hematocrit 36.6, platelet 174, INR 1.2. EKG shows electronically paced.   ASSESSMENT AND PLAN: This is a 64 year old African American male history of coronary artery disease status post stents, cardiomyopathy on amiodarone with defibrillator presents with chest pain two days and shortness of breath.  1. Chest pain. Will get cardiology evaluation with Dr. Juliann Paresallwood. Put the patient on low molecular weight heparin, aspirin. Cycle his cardiac enzymes. Give morphine, nitroglycerin. So far one cycle of cardiac enzymes shows no troponin leak. Will monitor on telemetry and admit this patient to observation status.  2. Chronic obstructive pulmonary disease. Patient has some minimal wheezing. Will continue with nebulizers and start the patient on Advair.  3. Type 2 diabetes. Patient is not on any medications but his glucose is high. Will check A1c. In addition will check lipid panel.  4. Cardiomyopathy. Will continue Lasix, ACE inhibitor, beta blocker, amiodarone. 5. Coronary artery disease. Continue aspirin and check lipid panel.  6. Leukopenia, most likely secondary to hepatitis C.  7. Deep vein thrombosis prophylaxis. Maintain with aspirin and full dose Lovenox.  8. CODE  STATUS: FULL CODE.   TOTAL TIME SPENT ON ADMISSION: 55 minutes. Patient is being admitted as observation status.   Thank you for allowing me to participate in the care of this patient.   ____________________________ Corie ChiquitoAmir A. Lafayette DragonFirozvi, MD aaf:cms D: 02/09/2012 04:28:00 ET T: 02/09/2012 07:43:20 ET JOB#: [id number]  cc: Karolee OhsAmir A. Lafayette DragonFirozvi, MD, <Dictator> Dwayne D. Juliann Paresallwood, M.D. [facility]  Caraline Deutschman Laverda PageA Lenah Messenger MD ELECTRONICALLY SIGNED 02/09/2012 21:27

## 2015-03-24 DEATH — deceased
